# Patient Record
Sex: Female | Born: 1990 | Race: Black or African American | Hispanic: No | Smoking: Never smoker
Health system: Southern US, Community
[De-identification: ages and names within clinical notes are randomized; demographics above are authoritative.]

## PROBLEM LIST (undated history)

## (undated) DIAGNOSIS — J45909 Unspecified asthma, uncomplicated: Secondary | ICD-10-CM

## (undated) HISTORY — DX: Unspecified asthma, uncomplicated: J45.909

---

## 2012-10-17 ENCOUNTER — Emergency Department (INDEPENDENT_AMBULATORY_CARE_PROVIDER_SITE_OTHER)
Admission: EM | Admit: 2012-10-17 | Discharge: 2012-10-17 | Disposition: A | Discharge status: Home / Self Care | Payer: Self-pay | Source: Home / Self Care | Attending: Family Medicine | Admitting: Family Medicine

## 2012-10-17 ENCOUNTER — Encounter (HOSPITAL_COMMUNITY): Payer: Self-pay | Admitting: Emergency Medicine

## 2012-10-17 ENCOUNTER — Emergency Department (INDEPENDENT_AMBULATORY_CARE_PROVIDER_SITE_OTHER): Payer: Self-pay

## 2012-10-17 DIAGNOSIS — M542 Cervicalgia: Secondary | ICD-10-CM

## 2012-10-17 DIAGNOSIS — M549 Dorsalgia, unspecified: Secondary | ICD-10-CM

## 2012-10-17 DIAGNOSIS — Z041 Encounter for examination and observation following transport accident: Secondary | ICD-10-CM

## 2012-10-17 MED ORDER — IBUPROFEN 800 MG PO TABS: 800.0000 mg | ORAL_TABLET | Freq: Three times a day (TID) | ORAL | Status: AC

## 2012-10-17 MED ORDER — CYCLOBENZAPRINE HCL 5 MG PO TABS: 5.0000 mg | ORAL_TABLET | Freq: Three times a day (TID) | ORAL | Status: AC | PRN
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
## 2014-01-14 ENCOUNTER — Other Ambulatory Visit: Payer: Self-pay | Admitting: Family Medicine

## 2014-01-14 ENCOUNTER — Ambulatory Visit
Admission: RE | Admit: 2014-01-14 | Discharge: 2014-01-14 | Disposition: A | Discharge status: Home / Self Care | Payer: BC Managed Care – PPO | Source: Ambulatory Visit | Attending: Family Medicine | Admitting: Family Medicine

## 2014-01-14 DIAGNOSIS — J45909 Unspecified asthma, uncomplicated: Secondary | ICD-10-CM

## 2015-05-08 IMAGING — CR DG CHEST 2V
2 series · 2 of 2 positions shown · non-contrast
Comparison: None.

CLINICAL DATA: Asthma, recent flu, shortness of breath

EXAM:
CHEST  2 VIEW

[w chest pa]
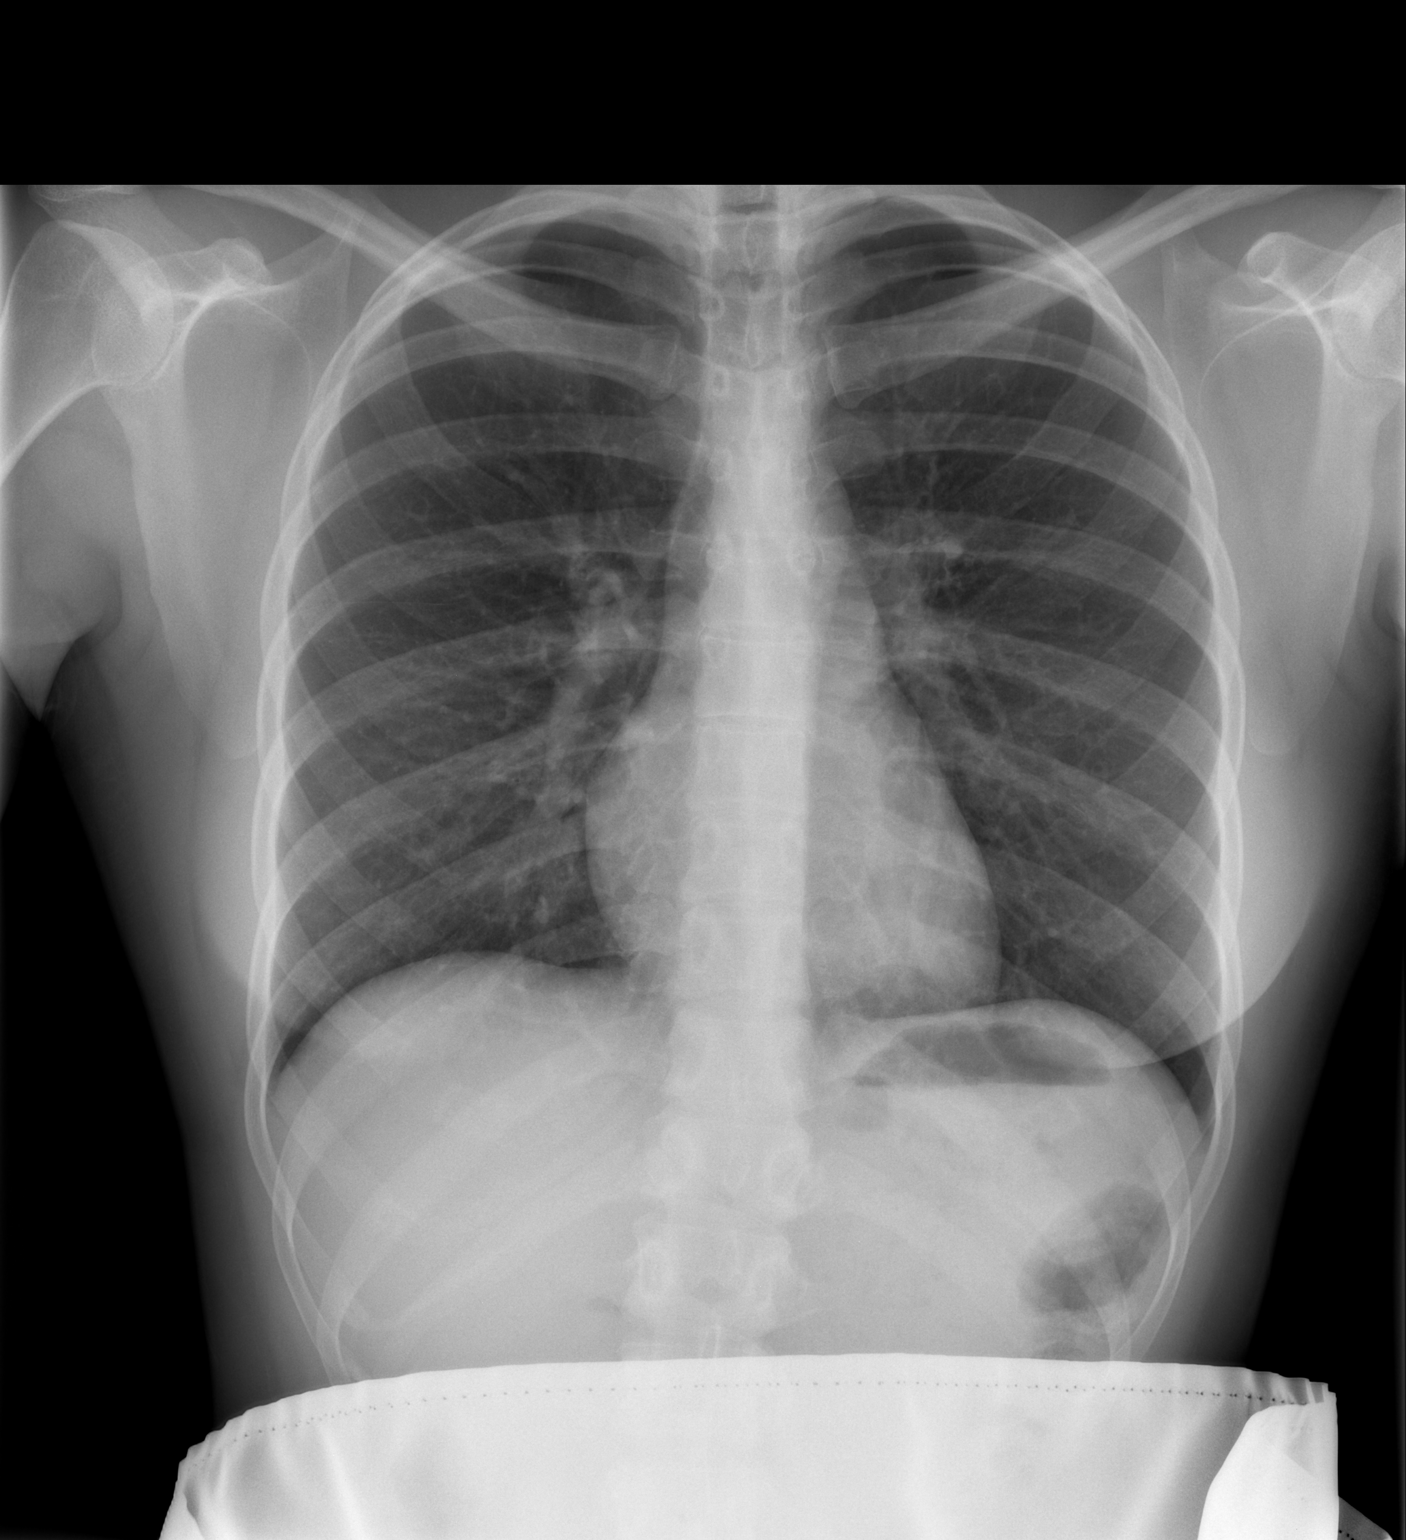

[w chest lat]
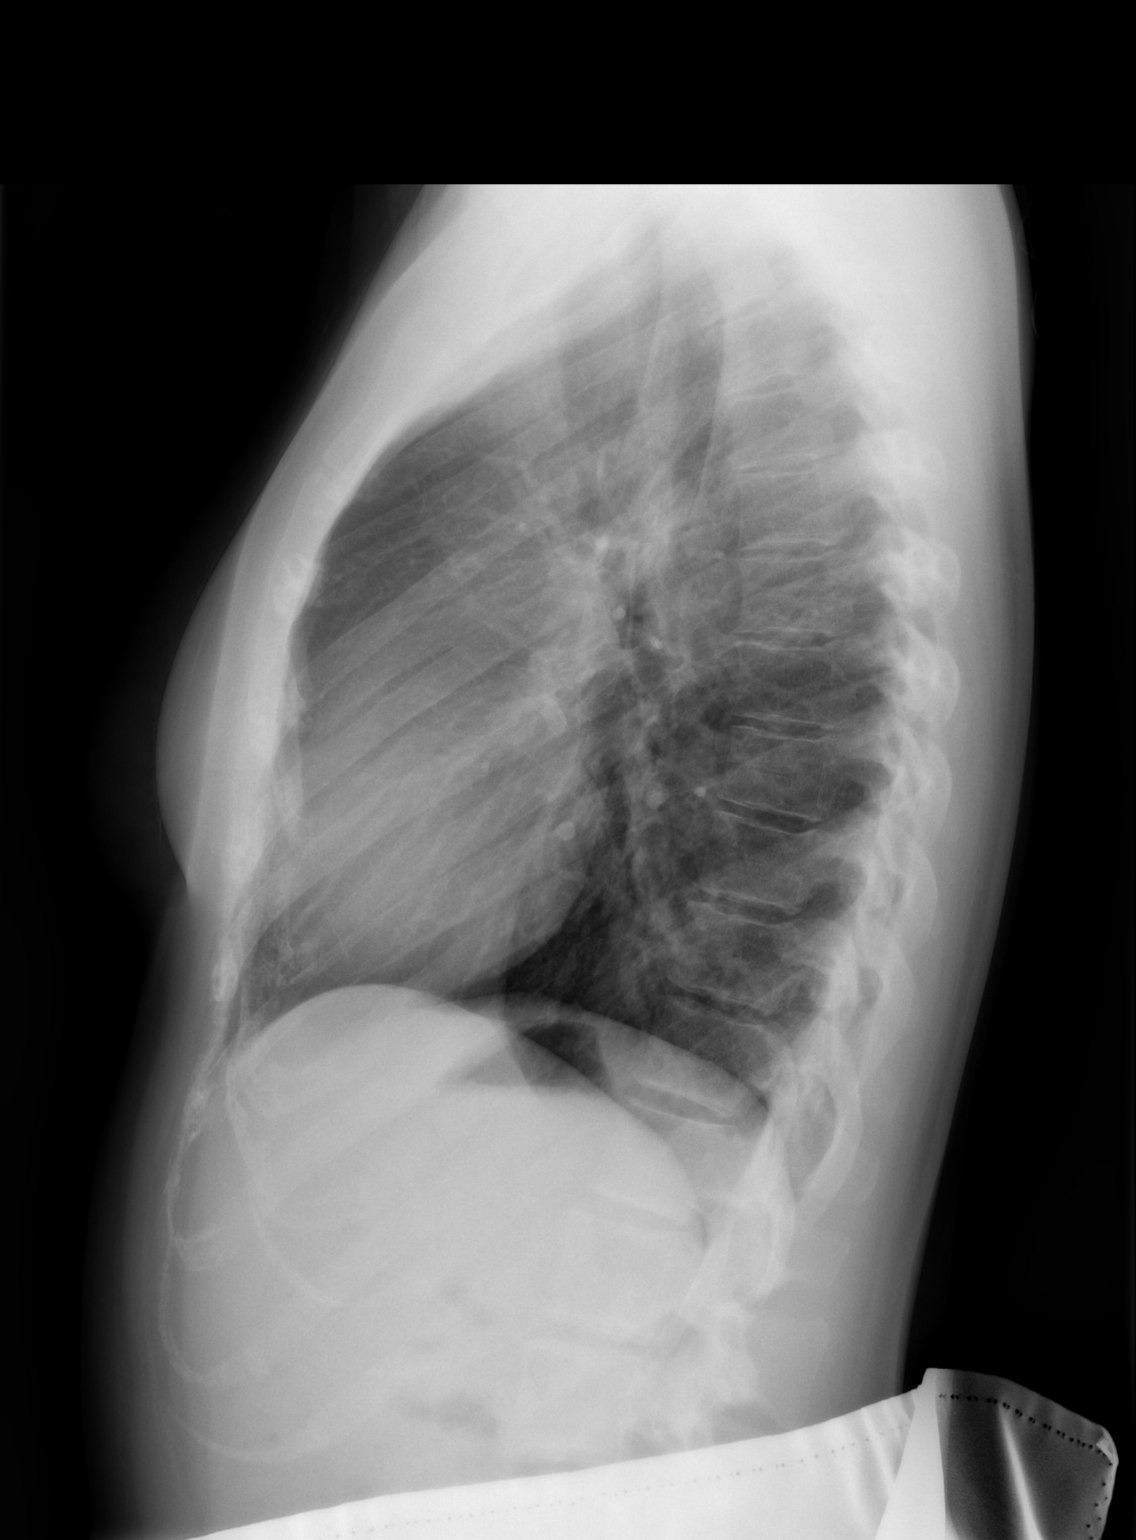

[2 of 2 positions shown; findings below may reference images not displayed]

FINDINGS: No active infiltrate or effusion is seen. Mediastinal contours
appear normal. The heart is within normal limits in size. No bony
abnormality is seen.
IMPRESSION: No active cardiopulmonary disease.

## 2017-10-16 ENCOUNTER — Ambulatory Visit: Payer: Self-pay | Admitting: Family Medicine

## 2017-10-16 NOTE — Progress Notes (Deleted)
  No chief complaint on file.   HPI  4 review of systems  No past medical history on file.  Current Outpatient Medications  Medication Sig Dispense Refill  . cyclobenzaprine (FLEXERIL) 5 MG tablet Take 1 tablet (5 mg total) by mouth 3 (three) times daily as needed for muscle spasms. 30 tablet 0  . ibuprofen (ADVIL,MOTRIN) 800 MG tablet Take 1 tablet (800 mg total) by mouth 3 (three) times daily. For back pains. 30 tablet 0   No current facility-administered medications for this visit.     Allergies: No Known Allergies  *** The histories are not reviewed yet. Please review them in the "History" navigator section and refresh this SmartLink.  Social History   Socioeconomic History  . Marital status: Single    Spouse name: Not on file  . Number of children: Not on file  . Years of education: Not on file  . Highest education level: Not on file  Social Needs  . Financial resource strain: Not on file  . Food insecurity - worry: Not on file  . Food insecurity - inability: Not on file  . Transportation needs - medical: Not on file  . Transportation needs - non-medical: Not on file  Occupational History  . Not on file  Tobacco Use  . Smoking status: Not on file  Substance and Sexual Activity  . Alcohol use: Not on file  . Drug use: Not on file  . Sexual activity: Not on file  Other Topics Concern  . Not on file  Social History Narrative  . Not on file    No family history on file.   ROS Review of Systems See HPI Constitution: No fevers or chills No malaise No diaphoresis Skin: No rash or itching Eyes: no blurry vision, no double vision GU: no dysuria or hematuria Neuro: no dizziness or headaches * all others reviewed and negative   Objective: There were no vitals filed for this visit.  Physical Exam  Assessment and Plan There are no diagnoses linked to this encounter.   Yvonne Boyer

## 2017-10-17 ENCOUNTER — Other Ambulatory Visit: Payer: Self-pay

## 2017-10-17 ENCOUNTER — Encounter: Payer: Self-pay | Admitting: Family Medicine

## 2017-10-17 ENCOUNTER — Ambulatory Visit (INDEPENDENT_AMBULATORY_CARE_PROVIDER_SITE_OTHER): Payer: Managed Care, Other (non HMO) | Admitting: Family Medicine

## 2017-10-17 VITALS — BP 120/70 | HR 88 | Temp 98.9°F | Resp 16 | Ht 66.0 in | Wt 167.8 lb

## 2017-10-17 DIAGNOSIS — R002 Palpitations: Secondary | ICD-10-CM | POA: Diagnosis not present

## 2017-10-17 NOTE — Progress Notes (Signed)
Chief Complaint  Patient presents with  . New Patient (Initial Visit)    discuss ? anxiety, per pt she can just be sitting and heart begins to race, tightness in chest that feels like acid reflux when its stuck in your throat    HPI  She reports that she feels like her heart will just start racing out of the blue She reports that she checks her pulse and it feels fast to her She reports that she sometimes gets this in her sleep She states that in a week she can have up to 3-4 episodes of palpitations She reports that she will typically focus on her breathing to calm herself down The episodes can be 30-60 mins She reports that they have been intermittent but consistent  She reports that it seemed like it's onset was aggravated by a night of drinking alcohol 2 months ago  She states that she has normal period cycles lasting every 6-7 days She denies changes to hair and skin She denies hot or cold intolerance No changes to her bowel movements     Past Medical History:  Diagnosis Date  . Asthma     Current Outpatient Medications  Medication Sig Dispense Refill  . albuterol (PROVENTIL HFA;VENTOLIN HFA) 108 (90 Base) MCG/ACT inhaler Inhale 2 puffs into the lungs every 6 (six) hours as needed for wheezing or shortness of breath.    . valACYclovir (VALTREX) 500 MG tablet Take 500 mg by mouth daily as needed.    . cyclobenzaprine (FLEXERIL) 5 MG tablet Take 1 tablet (5 mg total) by mouth 3 (three) times daily as needed for muscle spasms. (Patient not taking: Reported on 10/17/2017) 30 tablet 0  . ibuprofen (ADVIL,MOTRIN) 800 MG tablet Take 1 tablet (800 mg total) by mouth 3 (three) times daily. For back pains. (Patient not taking: Reported on 10/17/2017) 30 tablet 0   No current facility-administered medications for this visit.     Allergies:  Allergies  Allergen Reactions  . Strawberry Extract     History reviewed. No pertinent surgical history.  Social History    Socioeconomic History  . Marital status: Significant Other    Spouse name: None  . Number of children: None  . Years of education: None  . Highest education level: None  Social Needs  . Financial resource strain: Not hard at all  . Food insecurity - worry: Never true  . Food insecurity - inability: Never true  . Transportation needs - medical: No  . Transportation needs - non-medical: No  Occupational History  . None  Tobacco Use  . Smoking status: Never Smoker  . Smokeless tobacco: Never Used  Substance and Sexual Activity  . Alcohol use: Yes    Comment: 1-3 socially  . Drug use: No  . Sexual activity: None  Other Topics Concern  . None  Social History Narrative  . None    Family History  Adopted: Yes     ROS Review of Systems See HPI Constitution: No fevers or chills No malaise No diaphoresis Skin: No rash or itching Eyes: no blurry vision, no double vision GU: no dysuria or hematuria Neuro: no dizziness or headaches all others reviewed and negative   Objective: Vitals:   10/17/17 1024  BP: 120/70  Pulse: 88  Resp: 16  Temp: 98.9 F (37.2 C)  TempSrc: Oral  SpO2: 100%  Weight: 167 lb 12.8 oz (76.1 kg)  Height: 5\' 6"  (1.676 m)    Physical Exam  Constitutional: She  is oriented to person, place, and time. She appears well-developed and well-nourished.  HENT:  Head: Normocephalic and atraumatic.  Eyes: Conjunctivae and EOM are normal. Pupils are equal, round, and reactive to light.  Neck: Normal range of motion. No thyromegaly present.  Cardiovascular: Normal rate, regular rhythm and normal heart sounds.  No murmur heard. Pulmonary/Chest: Effort normal and breath sounds normal. No stridor. No respiratory distress.  Abdominal: Soft. Bowel sounds are normal. She exhibits no distension. There is no tenderness. There is no guarding.  Musculoskeletal: Normal range of motion. She exhibits no edema.  Neurological: She is alert and oriented to person,  place, and time.  Skin: Skin is warm. Capillary refill takes less than 2 seconds. No erythema.  Psychiatric: She has a normal mood and affect. Her behavior is normal. Judgment and thought content normal.    ECG sinus rhythm, no arrhythmia  Assessment and Plan Yvonne Boyer was seen today for new patient (initial visit).  Diagnoses and all orders for this visit:  Heart palpitations- will send for holter monitor ecg at rest wnl Will check labs Orthostatic vitals in normal ranges  -     EKG 12-Lead -     HOLTER MONITOR - 72 HOUR; Future -     CBC -     TSH -     Basic metabolic panel -     Orthostatic vital signs     Yvonne Boyer

## 2017-10-17 NOTE — Patient Instructions (Addendum)
     IF you received an x-ray today, you will receive an invoice from [facility]. Please contact [facility] at [phone] with questions or concerns regarding your invoice.   IF you received labwork today, you will receive an invoice from [facility]. Please contact [facility] at [phone] with questions or concerns regarding your invoice.   Our billing staff will not be able to assist you with questions regarding bills from these companies.  You will be contacted with the lab results as soon as they are available. The fastest way to get your results is to activate your My Chart account. Instructions are located on the last page of this paperwork. If you have not heard from us regarding the results in 2 weeks, please contact this office.     Holter Monitoring A Holter monitor is a small device that is used to detect abnormal heart rhythms. It clips to your clothing and is connected by wires to flat, sticky disks (electrodes) that attach to your chest. It is worn continuously for 24-48 hours. Follow these instructions at home:  Wear your Holter monitor at all times, even while exercising and sleeping, for as long as directed by your health care provider.  Make sure that the Holter monitor is safely clipped to your clothing or close to your body as recommended by your health care provider.  Do not get the monitor or wires wet.  Do not put body lotion or moisturizer on your chest.  Keep your skin clean.  Keep a diary of your daily activities, such as walking and doing chores. If you feel that your heartbeat is abnormal or that your heart is fluttering or skipping a beat: ? Record what you are doing when it happens. ? Record what time of day the symptoms occur.  Return your Holter monitor as directed by your health care provider.  Keep all follow-up visits as directed by your health care provider. This is important. Get help right away if:  You feel lightheaded or  you faint.  You have trouble breathing.  You feel pain in your chest, upper arm, or jaw.  You feel sick to your stomach and your skin is pale, cool, or damp.  You heartbeat feels unusual or abnormal. This information is not intended to replace advice given to you by your health care provider. Make sure you discuss any questions you have with your health care provider. Document Released: 07/13/2004 Document Revised: 03/22/2016 Document Reviewed: 05/24/2014 Elsevier Interactive Patient Education  [employer]2018 Elsevier Inc.

## 2017-10-18 LAB — CBC
Hematocrit: 42.5 % (ref 34.0–46.6)
Hemoglobin: 13.8 g/dL (ref 11.1–15.9)
MCH: 29.6 pg (ref 26.6–33.0)
MCHC: 32.5 g/dL (ref 31.5–35.7)
MCV: 91 fL (ref 79–97)
PLATELETS: 284 10*3/uL (ref 150–379)
RBC: 4.66 x10E6/uL (ref 3.77–5.28)
RDW: 12.4 % (ref 12.3–15.4)
WBC: 4.9 10*3/uL (ref 3.4–10.8)

## 2017-10-18 LAB — BASIC METABOLIC PANEL
BUN/Creatinine Ratio: 11 (ref 9–23)
BUN: 9 mg/dL (ref 6–20)
CALCIUM: 9.5 mg/dL (ref 8.7–10.2)
CHLORIDE: 106 mmol/L (ref 96–106)
CO2: 21 mmol/L (ref 20–29)
Creatinine, Ser: 0.85 mg/dL (ref 0.57–1.00)
GFR, EST AFRICAN AMERICAN: 109 mL/min/{1.73_m2} (ref >59)
GFR, EST NON AFRICAN AMERICAN: 95 mL/min/{1.73_m2} (ref >59)
Glucose: 81 mg/dL (ref 65–99)
POTASSIUM: 4.4 mmol/L (ref 3.5–5.2)
SODIUM: 141 mmol/L (ref 134–144)

## 2017-10-18 LAB — TSH: TSH: 0.975 u[IU]/mL (ref 0.450–4.500)

## 2017-10-21 ENCOUNTER — Encounter: Payer: Self-pay | Admitting: Family Medicine

## 2017-11-01 ENCOUNTER — Telehealth: Payer: Self-pay | Admitting: Family Medicine

## 2017-11-01 NOTE — Telephone Encounter (Signed)
Spoke with [facility] and they said we could just fax order for holter monitor rather than need a referral. This has been faxed 11/01/17 with insurance card, demo sheet, labs, and ekg

## 2017-11-04 ENCOUNTER — Telehealth: Payer: Self-pay | Admitting: Family Medicine

## 2017-11-04 DIAGNOSIS — R002 Palpitations: Secondary | ICD-10-CM

## 2017-11-04 NOTE — Telephone Encounter (Signed)
April from [facility] called about order for holter monitor. She stated that their holter monitors are for 24 to 48 hours. She said an event monitor could be ordered instead that is weekly but the pt will pay the full price for it even if she only uses it for 3 days. They wanted to know if we would like to do the holter monitor for 48 hrs rather than 72,  or the weekly. Please advise. April can be reached at [phone]. Thanks!

## 2017-11-06 NOTE — Telephone Encounter (Signed)
msg sent to Dr Stallings. 

## 2017-11-06 NOTE — Telephone Encounter (Signed)
Dr Jodi Marblegangi's office called again - the pt is due to ome in this morning.  they need to have a response regarding this issue.  Please return call at [phone]

## 2017-11-08 ENCOUNTER — Encounter: Payer: Self-pay | Admitting: Family Medicine

## 2017-11-08 NOTE — Telephone Encounter (Signed)
Spoke to Cardiology. Pt is wearing an event monitor for a 7 day duration

## 2017-11-21 ENCOUNTER — Ambulatory Visit (INDEPENDENT_AMBULATORY_CARE_PROVIDER_SITE_OTHER): Payer: Managed Care, Other (non HMO) | Admitting: Family Medicine

## 2017-11-21 ENCOUNTER — Encounter: Payer: Self-pay | Admitting: Family Medicine

## 2017-11-21 ENCOUNTER — Other Ambulatory Visit: Payer: Self-pay

## 2017-11-21 VITALS — BP 122/78 | HR 79 | Temp 99.2°F | Resp 18 | Ht 64.96 in | Wt 169.6 lb

## 2017-11-21 DIAGNOSIS — Z3169 Encounter for other general counseling and advice on procreation: Secondary | ICD-10-CM

## 2017-11-21 DIAGNOSIS — R002 Palpitations: Secondary | ICD-10-CM | POA: Diagnosis not present

## 2017-11-21 NOTE — Progress Notes (Signed)
Chief Complaint  Patient presents with  . Palpitations    f/u    HPI   Pt reports that she continues to have palpitations Last night she had an episode of palpitations for 1.5 hours She reports that she is getting them every day She states that she completed the holter monitor  She states that she sometimes stretches which helps and so does taking deep breaths She was evaluated 10/17/2017 and her labs were all normal.   Preconception Pt and her partner are considering pregnancy in the next year They are in same sex marriage Reports that she gets monthly periods Patient's last menstrual period was 11/23/2016 (approximate).  Past Medical History:  Diagnosis Date  . Asthma     Current Outpatient Medications  Medication Sig Dispense Refill  . albuterol (PROVENTIL HFA;VENTOLIN HFA) 108 (90 Base) MCG/ACT inhaler Inhale 2 puffs into the lungs every 6 (six) hours as needed for wheezing or shortness of breath.    . cyclobenzaprine (FLEXERIL) 5 MG tablet Take 1 tablet (5 mg total) by mouth 3 (three) times daily as needed for muscle spasms. 30 tablet 0  . ibuprofen (ADVIL,MOTRIN) 800 MG tablet Take 1 tablet (800 mg total) by mouth 3 (three) times daily. For back pains. 30 tablet 0  . valACYclovir (VALTREX) 500 MG tablet Take 500 mg by mouth daily as needed.     No current facility-administered medications for this visit.     Allergies:  Allergies  Allergen Reactions  . Strawberry Extract     History reviewed. No pertinent surgical history.  Social History   Socioeconomic History  . Marital status: Significant Other    Spouse name: None  . Number of children: 0  . Years of education: None  . Highest education level: None  Social Needs  . Financial resource strain: Not hard at all  . Food insecurity - worry: Never true  . Food insecurity - inability: Never true  . Transportation needs - medical: No  . Transportation needs - non-medical: No  Occupational History  . None   Tobacco Use  . Smoking status: Never Smoker  . Smokeless tobacco: Never Used  Substance and Sexual Activity  . Alcohol use: Yes    Comment: 1-3 socially  . Drug use: No  . Sexual activity: Yes  Other Topics Concern  . None  Social History Narrative  . None    Family History  Adopted: Yes     ROS Review of Systems See HPI Constitution: No fevers or chills No malaise No diaphoresis Skin: No rash or itching Eyes: no blurry vision, no double vision GU: no dysuria or hematuria Neuro: no dizziness or headaches all others reviewed and negative   Objective: Vitals:   11/21/17 0939  BP: 122/78  Pulse: 79  Resp: 18  Temp: 99.2 F (37.3 C)  TempSrc: Oral  SpO2: 99%  Weight: 169 lb 9.6 oz (76.9 kg)  Height: 5' 4.96" (1.65 m)    Physical Exam  Constitutional: She is oriented to person, place, and time. She appears well-developed and well-nourished.  HENT:  Head: Normocephalic and atraumatic.  Eyes: Conjunctivae and EOM are normal.  Cardiovascular: Normal rate, regular rhythm and normal heart sounds.  No murmur heard. Pulmonary/Chest: Effort normal and breath sounds normal. No stridor. No respiratory distress.  Neurological: She is alert and oriented to person, place, and time.  Skin: Skin is warm. Capillary refill takes less than 2 seconds.  Psychiatric: She has a normal mood and affect. Her  behavior is normal. Judgment and thought content normal.    Holter monitor reviewed and only benign findings noted    Assessment and Plan Happy was seen today for palpitations.  Diagnoses and all orders for this visit:  Heart palpitations- discussed metoprolol for palpitations  Preconception counseling- advised pt to start a healthy physical and financial plan Discussed vaccines Discussed prenatal vitamin Pt is in same sex relationship so discussed how to get sperm (friend or sperm bank) Also discussed options and advised discussion with Gynecology    Yvonne Boyer

## 2017-11-21 NOTE — Patient Instructions (Addendum)
For metoprolol: take a 1/2 tablet daily for a month then decrease to every other day next month.      [facility]: Tracey HarriesHenley Thomas F MD  5.0 (1)  Doctor  [address]  [phone]  Open ? Closes 5PM   [facility]  4.0 [phone]  Obstetrician-Gynecologist  [address]  [phone]  Open ? Closes 5PM   [facility]   3.5 (52)  Obstetrician-Gynecologist  [place]  [phone]  Open ? Closes 4:30PM    IF you received an x-ray today, you will receive an invoice from [facility]. Please contact [facility] at [phone] with questions or concerns regarding your invoice.   IF you received labwork today, you will receive an invoice from [facility]. Please contact [facility] at [phone] with questions or concerns regarding your invoice.   Our billing staff will not be able to assist you with questions regarding bills from these companies.  You will be contacted with the lab results as soon as they are available. The fastest way to get your results is to activate your My Chart account. Instructions are located on the last page of this paperwork. If you have not heard from us regarding the results in 2 weeks, please contact this office.

## 2017-11-24 ENCOUNTER — Encounter: Payer: Self-pay | Admitting: Family Medicine

## 2017-11-24 MED ORDER — METOPROLOL SUCCINATE ER 25 MG PO TB24
12.5000 mg | ORAL_TABLET | Freq: Every day | ORAL | 0 refills | Status: DC
Start: 2017-11-24 — End: 2017-12-28

## 2017-11-25 ENCOUNTER — Encounter: Payer: Self-pay | Admitting: Family Medicine

## 2017-11-25 DIAGNOSIS — R002 Palpitations: Secondary | ICD-10-CM | POA: Insufficient documentation

## 2017-12-25 ENCOUNTER — Ambulatory Visit: Payer: Self-pay | Admitting: Family Medicine

## 2017-12-25 NOTE — Progress Notes (Deleted)
  No chief complaint on file.   HPI  4 review of systems  Past Medical History:  Diagnosis Date  . Asthma     Current Outpatient Medications  Medication Sig Dispense Refill  . albuterol (PROVENTIL HFA;VENTOLIN HFA) 108 (90 Base) MCG/ACT inhaler Inhale 2 puffs into the lungs every 6 (six) hours as needed for wheezing or shortness of breath.    . cyclobenzaprine (FLEXERIL) 5 MG tablet Take 1 tablet (5 mg total) by mouth 3 (three) times daily as needed for muscle spasms. 30 tablet 0  . ibuprofen (ADVIL,MOTRIN) 800 MG tablet Take 1 tablet (800 mg total) by mouth 3 (three) times daily. For back pains. 30 tablet 0  . metoprolol succinate (TOPROL-XL) 25 MG 24 hr tablet Take 0.5 tablets (12.5 mg total) by mouth daily. 30 tablet 0  . valACYclovir (VALTREX) 500 MG tablet Take 500 mg by mouth daily as needed.     No current facility-administered medications for this visit.     Allergies:  Allergies  Allergen Reactions  . Strawberry Extract     No past surgical history on file.  Social History   Socioeconomic History  . Marital status: Significant Other    Spouse name: Not on file  . Number of children: 0  . Years of education: Not on file  . Highest education level: Not on file  Social Needs  . Financial resource strain: Not hard at all  . Food insecurity - worry: Never true  . Food insecurity - inability: Never true  . Transportation needs - medical: No  . Transportation needs - non-medical: No  Occupational History  . Not on file  Tobacco Use  . Smoking status: Never Smoker  . Smokeless tobacco: Never Used  Substance and Sexual Activity  . Alcohol use: Yes    Comment: 1-3 socially  . Drug use: No  . Sexual activity: Yes  Other Topics Concern  . Not on file  Social History Narrative  . Not on file    Family History  Adopted: Yes     ROS Review of Systems See HPI Constitution: No fevers or chills No malaise No diaphoresis Skin: No rash or itching Eyes: no  blurry vision, no double vision GU: no dysuria or hematuria Neuro: no dizziness or headaches * all others reviewed and negative   Objective: There were no vitals filed for this visit.  Physical Exam  Assessment and Plan There are no diagnoses linked to this encounter.   Delores P [employer] Corporationaddy

## 2017-12-28 ENCOUNTER — Telehealth: Payer: Self-pay

## 2017-12-28 ENCOUNTER — Ambulatory Visit (INDEPENDENT_AMBULATORY_CARE_PROVIDER_SITE_OTHER): Payer: Managed Care, Other (non HMO) | Admitting: Family Medicine

## 2017-12-28 ENCOUNTER — Other Ambulatory Visit: Payer: Self-pay

## 2017-12-28 ENCOUNTER — Encounter: Payer: Self-pay | Admitting: Family Medicine

## 2017-12-28 VITALS — BP 120/82 | HR 77 | Temp 98.5°F | Resp 18 | Ht 64.96 in | Wt 170.4 lb

## 2017-12-28 DIAGNOSIS — R000 Tachycardia, unspecified: Secondary | ICD-10-CM

## 2017-12-28 DIAGNOSIS — R002 Palpitations: Secondary | ICD-10-CM | POA: Diagnosis not present

## 2017-12-28 MED ORDER — PROPRANOLOL HCL 20 MG PO TABS: 20.0000 mg | ORAL_TABLET | Freq: Every day | ORAL | 0 refills | Status: AC

## 2017-12-28 NOTE — Progress Notes (Signed)
Chief Complaint  Patient presents with  . paperwork completion for ADA/FMLA    HPI   Pt reports that she still feels like her heart is racing despite taking metoprolol She states that she can sometimes feel like nothing is happening and her heart will race The metoprolol xl 12.5mg  is making her feel sleepy. Because of this she is unable to finish phone calls or concentrate  She denies any syncopal or pre-syncopal episodes    Past Medical History:  Diagnosis Date  . Asthma     Current Outpatient Medications  Medication Sig Dispense Refill  . albuterol (PROVENTIL HFA;VENTOLIN HFA) 108 (90 Base) MCG/ACT inhaler Inhale 2 puffs into the lungs every 6 (six) hours as needed for wheezing or shortness of breath.    . cyclobenzaprine (FLEXERIL) 5 MG tablet Take 1 tablet (5 mg total) by mouth 3 (three) times daily as needed for muscle spasms. 30 tablet 0  . ibuprofen (ADVIL,MOTRIN) 800 MG tablet Take 1 tablet (800 mg total) by mouth 3 (three) times daily. For back pains. 30 tablet 0  . valACYclovir (VALTREX) 500 MG tablet Take 500 mg by mouth daily as needed.    . propranolol (INDERAL) 20 MG tablet Take 1 tablet (20 mg total) by mouth daily. 30 tablet 0   No current facility-administered medications for this visit.     Allergies:  Allergies  Allergen Reactions  . Strawberry Extract     No past surgical history on file.  Social History   Socioeconomic History  . Marital status: Significant Other    Spouse name: None  . Number of children: 0  . Years of education: None  . Highest education level: None  Social Needs  . Financial resource strain: Not hard at all  . Food insecurity - worry: Never true  . Food insecurity - inability: Never true  . Transportation needs - medical: No  . Transportation needs - non-medical: No  Occupational History  . None  Tobacco Use  . Smoking status: Never Smoker  . Smokeless tobacco: Never Used  Substance and Sexual Activity  . Alcohol  use: Yes    Comment: 1-3 socially  . Drug use: No  . Sexual activity: Yes  Other Topics Concern  . None  Social History Narrative  . None    Family History  Adopted: Yes     ROS Review of Systems See HPI Constitution: No fevers or chills No malaise No diaphoresis Skin: No rash or itching Eyes: no blurry vision, no double vision GU: no dysuria or hematuria Neuro: no dizziness or headaches all others reviewed and negative   Objective: Vitals:   12/28/17 1355  BP: 120/82  Pulse: 77  Resp: 18  Temp: 98.5 F (36.9 C)  TempSrc: Oral  SpO2: 99%  Weight: 170 lb 6.4 oz (77.3 kg)  Height: 5' 4.96" (1.65 m)    Physical Exam  Constitutional: She is oriented to person, place, and time. She appears well-developed and well-nourished.  HENT:  Head: Normocephalic and atraumatic.  Eyes: Conjunctivae and EOM are normal.  Cardiovascular: Normal rate, regular rhythm and normal heart sounds.  No murmur heard. Pulmonary/Chest: Effort normal and breath sounds normal. No stridor. No respiratory distress. She has no wheezes.  Musculoskeletal: Normal range of motion. She exhibits no edema.  Neurological: She is alert and oriented to person, place, and time.  Skin: Skin is warm. Capillary refill takes less than 2 seconds.  Psychiatric: She has a normal mood and affect. Her behavior  is normal. Judgment and thought content normal.     Assessment and Plan Jannice was seen today for paperwork completion for ada/fmla.  Diagnoses and all orders for this visit:  Heart palpitations -     propranolol (INDERAL) 20 MG tablet; Take 1 tablet (20 mg total) by mouth daily. -     Ambulatory referral to Cardiology  Tachycardia  Other orders -     Cancel: Tdap vaccine greater than or equal to 7yo IM    Pt had holter monitor that showed pvcs Advised to try toprol prn  toprol too strong and pt continued to have palpitations and tachycardia despite her toprol use Will refer for consultation  with Cardiology -  Work up so far shows no anemia, thyroid disorder or electrolyte imbalance  Kyri Dai A Lyndol Vanderheiden

## 2017-12-28 NOTE — Telephone Encounter (Signed)
ADA/FMLA Forms completed by Dr. Creta LevinStallings on 12/28/2017 at clinic appt.  Forms can be picked up on Monday.  Copies of forms placed in the to be scanned box, Mayline Dragon will keep a hard copy and Lera Gaines has the original form. Dgaddy, CMA

## 2017-12-28 NOTE — Patient Instructions (Signed)
     IF you received an x-ray today, you will receive an invoice from [facility]. Please contact [facility] at [phone] with questions or concerns regarding your invoice.   IF you received labwork today, you will receive an invoice from [facility]. Please contact [facility] at [phone] with questions or concerns regarding your invoice.   Our billing staff will not be able to assist you with questions regarding bills from these companies.  You will be contacted with the lab results as soon as they are available. The fastest way to get your results is to activate your My Chart account. Instructions are located on the last page of this paperwork. If you have not heard from us regarding the results in 2 weeks, please contact this office.     

## 2017-12-30 NOTE — Telephone Encounter (Signed)
Paperwork scanned and faxed on 12/30/17 °

## 2018-05-31 ENCOUNTER — Emergency Department (HOSPITAL_COMMUNITY)
Admission: EM | Admit: 2018-05-31 | Discharge: 2018-05-31 | Disposition: A | Discharge status: Home / Self Care | Payer: 59 | Attending: Emergency Medicine | Admitting: Emergency Medicine

## 2018-05-31 ENCOUNTER — Other Ambulatory Visit: Payer: Self-pay

## 2018-05-31 DIAGNOSIS — L259 Unspecified contact dermatitis, unspecified cause: Secondary | ICD-10-CM | POA: Insufficient documentation

## 2018-05-31 DIAGNOSIS — L309 Dermatitis, unspecified: Secondary | ICD-10-CM

## 2018-05-31 DIAGNOSIS — Z79899 Other long term (current) drug therapy: Secondary | ICD-10-CM | POA: Diagnosis not present

## 2018-05-31 DIAGNOSIS — J45909 Unspecified asthma, uncomplicated: Secondary | ICD-10-CM | POA: Insufficient documentation

## 2018-05-31 MED ORDER — TRIAMCINOLONE ACETONIDE 0.1 % EX CREA: 1.0000 "application " | TOPICAL_CREAM | Freq: Two times a day (BID) | CUTANEOUS | 0 refills | Status: DC

## 2018-05-31 MED ORDER — HYDROXYZINE HCL 25 MG PO TABS: 25.0000 mg | ORAL_TABLET | Freq: Four times a day (QID) | ORAL | 0 refills | Status: AC

## 2018-05-31 MED ORDER — PREDNISONE 50 MG PO TABS: ORAL_TABLET | ORAL | 0 refills | Status: AC

## 2018-05-31 NOTE — ED Triage Notes (Signed)
Pt to ER for eczema "flare up." skin rash to right forearm and left breast.

## 2018-05-31 NOTE — ED Provider Notes (Signed)
[facility] EMERGENCY DEPARTMENT Provider Note   CSN: [id number] Arrival date & time: 05/31/18  4098     History   Chief Complaint Chief Complaint  Patient presents with  . Eczema    HPI Yvonne Boyer is a 27 y.o. female who presents to the ED for a rash. Patient reports having eczema and this is a flare up. The areas of dryness and itching are located on the right forearm and left breast. Also on back of knees.  HPI  Past Medical History:  Diagnosis Date  . Asthma     Patient Active Problem List   Diagnosis Date Noted  . Palpitations 11/25/2017    No past surgical history on file.   OB History   None      Home Medications    Prior to Admission medications   Medication Sig Start Date End Date Taking? Authorizing Provider  albuterol (PROVENTIL HFA;VENTOLIN HFA) 108 (90 Base) MCG/ACT inhaler Inhale 2 puffs into the lungs every 6 (six) hours as needed for wheezing or shortness of breath.    [provider]  cyclobenzaprine (FLEXERIL) 5 MG tablet Take 1 tablet (5 mg total) by mouth 3 (three) times daily as needed for muscle spasms. 10/17/12   Linna Hoff, MD  hydrOXYzine (ATARAX/VISTARIL) 25 MG tablet Take 1 tablet (25 mg total) by mouth every 6 (six) hours. 05/31/18   Janne Napoleon, NP  ibuprofen (ADVIL,MOTRIN) 800 MG tablet Take 1 tablet (800 mg total) by mouth 3 (three) times daily. For back pains. 10/17/12   Linna Hoff, MD  predniSONE (DELTASONE) 50 MG tablet Take one tablet PO daily 05/31/18   Janne Napoleon, NP  propranolol (INDERAL) 20 MG tablet Take 1 tablet (20 mg total) by mouth daily. 12/28/17   Doristine Bosworth, MD  triamcinolone cream (KENALOG) 0.1 % Apply 1 application topically 2 (two) times daily. 05/31/18   Janne Napoleon, NP  valACYclovir (VALTREX) 500 MG tablet Take 500 mg by mouth daily as needed.    [provider]    Family History Family History  Adopted: Yes    Social History Social History   Tobacco  Use  . Smoking status: Never Smoker  . Smokeless tobacco: Never Used  Substance Use Topics  . Alcohol use: Yes    Comment: 1-3 socially  . Drug use: No     Allergies   Strawberry extract   Review of Systems Review of Systems  Skin: Positive for rash.       itching  All other systems reviewed and are negative.    Physical Exam Updated Vital Signs BP 123/90 (BP Location: Right Arm)   Pulse 86   Temp 98.9 F (37.2 C) (Oral)   Resp 20   LMP 05/22/2018   SpO2 100%   Physical Exam  Constitutional: Yvonne Boyer appears well-developed and well-nourished. No distress.  HENT:  Head: Normocephalic.  Eyes: EOM are normal.  Neck: Neck supple.  Cardiovascular: Normal rate.  Pulmonary/Chest: Effort normal. No respiratory distress.  Musculoskeletal: Normal range of motion.  Neurological: Yvonne Boyer is alert.  Skin: Rash noted.  Redness, scaling to the right forearm, palmar aspect. The left breast has oozing, crusting lesions, no red streaking or signs of infection. The back of the knees have dry crusty areas.   Psychiatric: Yvonne Boyer has a normal mood and affect. Her behavior is normal.  Nursing note and vitals reviewed.    ED Treatments / Results  Labs (all labs  ordered are listed, but only abnormal results are displayed) Labs Reviewed - No data to display  Radiology No results found.  Procedures Procedures (including critical care time)  Medications Ordered in ED Medications - No data to display   Initial Impression / Assessment and Plan / ED Course  I have reviewed the triage vital signs and the nursing notes. 27 y.o. female here with rash and itching that is similar to eczema flare ups Yvonne Boyer has had in the past. Will treat for rash and itching and patient to f/u with her PCP. Yvonne Boyer will return here for worsening symptoms or signs of infection.   Final Clinical Impressions(s) / ED Diagnoses   Final diagnoses:  Eczema, unspecified type    ED Discharge Orders        Ordered     triamcinolone cream (KENALOG) 0.1 %  2 times daily     05/31/18 0941    hydrOXYzine (ATARAX/VISTARIL) 25 MG tablet  Every 6 hours     05/31/18 0941    predniSONE (DELTASONE) 50 MG tablet     05/31/18 0941       Kerrie Buffaloeese, Lurae [place] Rapids City[place], NP 05/31/18 [id number]    Raeford RazorKohut, Stephen, MD 06/01/18 1739

## 2018-05-31 NOTE — Discharge Instructions (Addendum)
Do not drive while taking the medication for itching as it can make you sleepy. Follow up with your doctor for recheck. If you develop any signs of infection such as red streaking, yellow drainage or worsening symptoms return to the ED.

## 2018-08-07 ENCOUNTER — Encounter: Payer: Self-pay | Admitting: Family Medicine

## 2018-08-11 ENCOUNTER — Telehealth: Payer: Self-pay | Admitting: Family Medicine

## 2018-08-11 NOTE — Telephone Encounter (Signed)
Tried to call pt and reschedule her appt with Dr. Creta Levin for her CPE. Dr. Creta Levin will be leaving early that day and we will need to reschedule.   If pt calls back, please reschedule her for a PHY: CPE with Dr. Creta Levin at her convenience.   Thank you!

## 2018-08-12 ENCOUNTER — Encounter: Payer: 59 | Admitting: Family Medicine

## 2018-08-13 ENCOUNTER — Encounter: Payer: 59 | Admitting: Family Medicine

## 2018-08-15 ENCOUNTER — Encounter: Payer: Self-pay | Admitting: Physician Assistant

## 2018-08-15 ENCOUNTER — Telehealth: Payer: Self-pay | Admitting: Physician Assistant

## 2018-08-15 ENCOUNTER — Ambulatory Visit (INDEPENDENT_AMBULATORY_CARE_PROVIDER_SITE_OTHER): Payer: 59 | Admitting: Physician Assistant

## 2018-08-15 VITALS — BP 116/76 | HR 78 | Temp 98.2°F | Resp 17 | Ht 64.5 in | Wt 168.0 lb

## 2018-08-15 DIAGNOSIS — Z1329 Encounter for screening for other suspected endocrine disorder: Secondary | ICD-10-CM | POA: Diagnosis not present

## 2018-08-15 DIAGNOSIS — Z13220 Encounter for screening for lipoid disorders: Secondary | ICD-10-CM

## 2018-08-15 DIAGNOSIS — Z130 Encounter for screening for diseases of the blood and blood-forming organs and certain disorders involving the immune mechanism: Secondary | ICD-10-CM

## 2018-08-15 DIAGNOSIS — Z113 Encounter for screening for infections with a predominantly sexual mode of transmission: Secondary | ICD-10-CM

## 2018-08-15 DIAGNOSIS — Z0000 Encounter for general adult medical examination without abnormal findings: Secondary | ICD-10-CM

## 2018-08-15 DIAGNOSIS — J4520 Mild intermittent asthma, uncomplicated: Secondary | ICD-10-CM

## 2018-08-15 DIAGNOSIS — Z124 Encounter for screening for malignant neoplasm of cervix: Secondary | ICD-10-CM

## 2018-08-15 DIAGNOSIS — B9689 Other specified bacterial agents as the cause of diseases classified elsewhere: Secondary | ICD-10-CM

## 2018-08-15 DIAGNOSIS — Z13228 Encounter for screening for other metabolic disorders: Secondary | ICD-10-CM

## 2018-08-15 DIAGNOSIS — R21 Rash and other nonspecific skin eruption: Secondary | ICD-10-CM

## 2018-08-15 DIAGNOSIS — N760 Acute vaginitis: Secondary | ICD-10-CM

## 2018-08-15 LAB — POCT URINALYSIS DIP (MANUAL ENTRY)
Bilirubin, UA: NEGATIVE
Blood, UA: NEGATIVE
Glucose, UA: NEGATIVE mg/dL
Ketones, POC UA: NEGATIVE mg/dL
Leukocytes, UA: NEGATIVE
Nitrite, UA: NEGATIVE
Protein Ur, POC: NEGATIVE mg/dL
Spec Grav, UA: 1.025 (ref 1.010–1.025)
Urobilinogen, UA: 0.2 E.U./dL
pH, UA: 5.5 (ref 5.0–8.0)

## 2018-08-15 LAB — POCT WET + KOH PREP
Trich by wet prep: ABSENT
Yeast by KOH: ABSENT
Yeast by wet prep: ABSENT

## 2018-08-15 MED ORDER — ALBUTEROL SULFATE HFA 108 (90 BASE) MCG/ACT IN AERS: 2.0000 | INHALATION_SPRAY | Freq: Four times a day (QID) | RESPIRATORY_TRACT | 1 refills | Status: DC | PRN

## 2018-08-15 MED ORDER — TRIAMCINOLONE ACETONIDE 0.5 % EX OINT: 1.0000 "application " | TOPICAL_OINTMENT | Freq: Two times a day (BID) | CUTANEOUS | 0 refills | Status: DC

## 2018-08-15 MED ORDER — METRONIDAZOLE 0.75 % VA GEL: 1.0000 | Freq: Every day | VAGINAL | 0 refills | Status: AC

## 2018-08-15 NOTE — Patient Instructions (Addendum)
Your test is positive for bacterial vaginosis. Apply 1 applicator of metrogel gel vaginally at night before bed x 5 nights.  No yeast infection.   Kenalog 0.5% is a topical steroid do not use this for >2 weeks on your thigh and > 1 week on your nipple. If used for prolonged periods, this medication can cause thinning and/or discoloration of your skin. If your rash does not improve with this medication we may need to try oral medication.   [email or web address] (also see below for more info about self breast exam)  Keeping You Healthy   Get These Tests  Blood Pressure- Have your blood pressure checked by your healthcare provider at least once a year.  Normal blood pressure is 120/80.  Weight- Have your body mass index (BMI) calculated to screen for obesity.  BMI is a measure of body fat based on height and weight.  You can calculate your own BMI at https://www.west-esparza.com/  Cholesterol- Have your cholesterol checked every year.  Diabetes- Have your blood sugar checked every year if you have high blood pressure, high cholesterol, a family history of diabetes or if you are overweight.  Pap Test - Have a pap test every 1 to 5 years if you have been sexually active.  If you are older than 65 and recent pap tests have been normal you may not need additional pap tests.  In addition, if you have had a hysterectomy  for benign disease additional pap tests are not necessary.  Mammogram-Yearly mammograms are essential for early detection of breast cancer  Screening for Colon Cancer- Colonoscopy starting at age 50. Screening may begin sooner depending on your family history and other health conditions.  Follow up colonoscopy as directed by your Gastroenterologist.  Screening for Osteoporosis- Screening begins at age 74 with bone density scanning, sooner if you are at higher risk for developing Osteoporosis.   Get these medicines  Calcium with Vitamin D- Your body requires 1200-1500 mg of  Calcium a day and [phone] IU of Vitamin D a day.  You can only absorb 500 mg of Calcium at a time therefore Calcium must be taken in 2 or 3 separate doses throughout the day.  Hormones- Hormone therapy has been associated with increased risk for certain cancers and heart disease.  Talk to your healthcare provider about if you need relief from menopausal symptoms.  Aspirin- Ask your healthcare provider about taking Aspirin to prevent Heart Disease and Stroke.   Get these Immuniztions  Flu shot- Every fall  Pneumonia shot- Once after the age of 48; if you are younger ask your healthcare provider if you need a pneumonia shot.  Tetanus- Every ten years.  Zostavax- Once after the age of 78 to prevent shingles.   Take these steps  Don't smoke- Your healthcare provider can help you quit. For tips on how to quit, ask your healthcare provider or go to www.smokefree.gov or call 1-800 QUIT-NOW.  Be physically active- Exercise 5 days a week for a minimum of 30 minutes.  If you are not already physically active, start slow and gradually work up to 30 minutes of moderate physical activity.  Try walking, dancing, bike riding, swimming, etc.  Eat a healthy diet- Eat a variety of healthy foods such as fruits, vegetables, whole grains, low fat milk, low fat cheeses, yogurt, lean meats, chicken, fish, eggs, dried beans, tofu, etc.  For more information go to www.thenutritionsource.org  Dental visit- Brush and floss teeth twice daily; visit your dentist twice a  year.  Eye exam- Visit your Optometrist or Ophthalmologist yearly.  Drink alcohol in moderation- Limit alcohol intake to one drink or less a day.  Never drink and drive.  Depression- Your emotional health is as important as your physical health.  If you're feeling down or losing interest in things you normally enjoy, please talk to your healthcare provider.  Seat Belts- can save your life; always wear one  Smoke/Carbon Monoxide detectors- These  detectors need to be installed on the appropriate level of your home.  Replace batteries at least once a year.  Violence- If anyone is threatening or hurting you, please tell your healthcare provider.  Living Will/ Health care power of attorney- Discuss with your healthcare provider and family.  If you have lab work done today you will be contacted with your lab results within the next 2 weeks.  If you have not heard from [place] then please contact us. The fastest way to get your results is to register for My Chart.   Breast Self-Awareness Breast self-awareness means being familiar with how your breasts look and feel. It involves checking your breasts regularly and reporting any changes to your health care provider. Practicing breast self-awareness is important. A change in your breasts can be a sign of a serious medical problem. Being familiar with how your breasts look and feel allows you to find any problems early, when treatment is more likely to be successful. All women should practice breast self-awareness, including women who have had breast implants. How to do a breast self-exam One way to learn what is normal for your breasts and whether your breasts are changing is to do a breast self-exam. To do a breast self-exam: Look for Changes  1. Remove all the clothing above your waist. 2. Stand in front of a mirror in a room with good lighting. 3. Put your hands on your hips. 4. Push your hands firmly downward. 5. Compare your breasts in the mirror. Look for differences between them (asymmetry), such as: ? Differences in shape. ? Differences in size. ? Puckers, dips, and bumps in one breast and not the other. 6. Look at each breast for changes in your skin, such as: ? Redness. ? Scaly areas. 7. Look for changes in your nipples, such as: ? Discharge. ? Bleeding. ? Dimpling. ? Redness. ? A change in position. Feel for Changes  Carefully feel your breasts for lumps and changes. It is  best to do this while lying on your back on the floor and again while sitting or standing in the shower or tub with soapy water on your skin. Feel each breast in the following way:  Place the arm on the side of the breast you are examining above your head.  Feel your breast with the other hand.  Start in the nipple area and make  inch (2 cm) overlapping circles to feel your breast. Use the pads of your three middle fingers to do this. Apply light pressure, then medium pressure, then firm pressure. The light pressure will allow you to feel the tissue closest to the skin. The medium pressure will allow you to feel the tissue that is a little deeper. The firm pressure will allow you to feel the tissue close to the ribs.  Continue the overlapping circles, moving downward over the breast until you feel your ribs below your breast.  Move one finger-width toward the center of the body. Continue to use the  inch (2 cm) overlapping circles to feel  your breast as you move slowly up toward your collarbone.  Continue the up and down exam using all three pressures until you reach your armpit.  Write Down What You Find  Write down what is normal for each breast and any changes that you find. Keep a written record with breast changes or normal findings for each breast. By writing this information down, you do not need to depend only on memory for size, tenderness, or location. Write down where you are in your menstrual cycle, if you are still menstruating. If you are having trouble noticing differences in your breasts, do not get discouraged. With time you will become more familiar with the variations in your breasts and more comfortable with the exam. How often should I examine my breasts? Examine your breasts every month. If you are breastfeeding, the best time to examine your breasts is after a feeding or after using a breast pump. If you menstruate, the best time to examine your breasts is 5-7 days after  your period is over. During your period, your breasts are lumpier, and it may be more difficult to notice changes. When should I see my health care provider? See your health care provider if you notice:  A change in shape or size of your breasts or nipples.  A change in the skin of your breast or nipples, such as a reddened or scaly area.  Unusual discharge from your nipples.  A lump or thick area that was not there before.  Pain in your breasts.  Anything that concerns you.  This information is not intended to replace advice given to you by your health care provider. Make sure you discuss any questions you have with your health care provider. Document Released: 10/15/2005 Document Revised: 03/22/2016 Document Reviewed: 09/04/2015 Elsevier Interactive Patient Education  2018 [employer].  IF you received an x-ray today, you will receive an invoice from [facility]. Please contact [facility] at [phone] with questions or concerns regarding your invoice.   IF you received labwork today, you will receive an invoice from [facility]. Please contact [facility] at [phone] with questions or concerns regarding your invoice.   Our billing staff will not be able to assist you with questions regarding bills from these companies.  You will be contacted with the lab results as soon as they are available. The fastest way to get your results is to activate your My Chart account. Instructions are located on the last page of this paperwork. If you have not heard from [place] regarding the results in 2 weeks, please contact this office.

## 2018-08-15 NOTE — Telephone Encounter (Signed)
Copied from CRM [id number]. Topic: General - Other >> Aug 15, 2018 10:35 AM Jaquita Rector A wrote: Reason for CRM: Patient called back for Burna Mortimer said she had a message to call her back. Ph# [phone]

## 2018-08-15 NOTE — Progress Notes (Signed)
[facility] [place], [place] [address] [phone]  Date:  08/15/2018   Name:  Yvonne Boyer   DOB:  1991-03-29   MRN:  [id number]  PCP:  Yvonne Moron, MD    Chief Complaint: Annual Exam   History of Present Illness:  This is a 27 y.o. female with PMH  has a past medical history of Asthma. and eczema who is presenting for CPE. She is a pt of Dr. Nolon Boyer. She is not fasting today.   Asthma - needs refill of inhaler. Albuterol.  Uses inhaler 1-3 times/month "I think it's expired"  Complaints: eczema flare last month. Still itching. Using kenalog 0.1% daily. Spot on nipple and thigh that won't go away.  LMP: 08/01/2018 (Approximate)  Last pap: "it's been a while" no known abnormal PAPs Sexual history: sexually active with her wife only. No kids.  Immunizations: declines flu shot today. Needs tdap Dentist: regular Q6 months.  Eye: 20/20 b/l Diet/Exercise: no regular exercise. Eats too big servings. Mostly cooks at home.   Wt Readings from Last 3 Encounters:  08/15/18 168 lb (76.2 kg)  12/28/17 170 lb 6.4 oz (77.3 kg)  11/21/17 169 lb 9.6 oz (76.9 kg)    Review of Systems:  Review of Systems  Constitutional: Negative for chills, diaphoresis, fatigue and fever.  HENT: Negative for congestion, postnasal drip, rhinorrhea, sinus pressure, sneezing and sore throat.   Respiratory: Negative for cough, chest tightness, shortness of breath and wheezing.   Cardiovascular: Negative for chest pain and palpitations.  Gastrointestinal: Negative for abdominal pain, diarrhea, nausea and vomiting.  Genitourinary: Negative for decreased urine volume, difficulty urinating, dysuria, enuresis, flank pain, frequency, hematuria and urgency.  Musculoskeletal: Negative for back pain.  Skin: Positive for rash.  Neurological: Negative for dizziness, weakness, light-headedness and headaches.    Patient Active Problem List   Diagnosis Date Noted  . Palpitations  11/25/2017    Prior to Admission medications   Medication Sig Start Date End Date Taking? Authorizing Provider  albuterol (PROVENTIL HFA;VENTOLIN HFA) 108 (90 Base) MCG/ACT inhaler Inhale 2 puffs into the lungs every 6 (six) hours as needed for wheezing or shortness of breath.   Yes [provider]  cyclobenzaprine (FLEXERIL) 5 MG tablet Take 1 tablet (5 mg total) by mouth 3 (three) times daily as needed for muscle spasms. 10/17/12  Yes Kindl, Nelda Severe, MD  hydrOXYzine (ATARAX/VISTARIL) 25 MG tablet Take 1 tablet (25 mg total) by mouth every 6 (six) hours. 05/31/18  Yes Neese, [place], NP  ibuprofen (ADVIL,MOTRIN) 800 MG tablet Take 1 tablet (800 mg total) by mouth 3 (three) times daily. For back pains. 10/17/12  Yes Billy Fischer, MD  predniSONE (DELTASONE) 50 MG tablet Take one tablet PO daily 05/31/18  Yes Neese, [place], NP  propranolol (INDERAL) 20 MG tablet Take 1 tablet (20 mg total) by mouth daily. 12/28/17  Yes Stallings, Zoe A, MD  triamcinolone cream (KENALOG) 0.1 % Apply 1 application topically 2 (two) times daily. 05/31/18  Yes Neese, Kensal M, NP  valACYclovir (VALTREX) 500 MG tablet Take 500 mg by mouth daily as needed.   Yes [provider]    Allergies  Allergen Reactions  . Strawberry Extract     No past surgical history on file.  Social History   Tobacco Use  . Smoking status: Never Smoker  . Smokeless tobacco: Never Used  Substance Use Topics  . Alcohol use: Yes    Comment:  1-3 socially  . Drug use: No    Family History  Adopted: Yes    Medication list has been reviewed and updated.  Physical Examination:  Physical Exam  Constitutional: She is oriented to person, place, and time. She appears well-developed and well-nourished. No distress.  HENT:  Head: Normocephalic and atraumatic.  Mouth/Throat: Oropharynx is clear and moist.  Eyes: Pupils are equal, round, and reactive to light. Conjunctivae and EOM are normal.  Neck: Normal range of motion.  Neck supple. No thyromegaly present.  Cardiovascular: Normal rate, regular rhythm and normal heart sounds.  No murmur heard. Pulmonary/Chest: Effort normal and breath sounds normal. She has no wheezes.  Abdominal: Soft. She exhibits no mass. There is no tenderness.  Genitourinary: Vagina normal. Cervix exhibits no motion tenderness and no discharge. Right adnexum displays no mass and no tenderness. Left adnexum displays no mass and no tenderness.  Musculoskeletal: Normal range of motion.  Neurological: She is alert and oriented to person, place, and time. She has normal reflexes.  Skin: Skin is warm and dry.  Psychiatric: She has a normal mood and affect. Her behavior is normal. Judgment and thought content normal.  Vitals reviewed.   BP 116/76   Pulse 78   Temp 98.2 F (36.8 C) (Oral)   Resp 17   Ht 5' 4.5" (1.638 m)   Wt 168 lb (76.2 kg)   LMP 08/01/2018 (Approximate)   SpO2 98%   BMI 28.39 kg/m  Results for orders placed or performed in visit on 08/15/18  POCT urinalysis dipstick  Result Value Ref Range   Color, UA yellow yellow   Clarity, UA clear clear   Glucose, UA negative negative mg/dL   Bilirubin, UA negative negative   Ketones, POC UA negative negative mg/dL   Spec Grav, UA 1.025 1.010 - 1.025   Blood, UA negative negative   pH, UA 5.5 5.0 - 8.0   Protein Ur, POC negative negative mg/dL   Urobilinogen, UA 0.2 0.2 or 1.0 E.U./dL   Nitrite, UA Negative Negative   Leukocytes, UA Negative Negative  POCT Wet + KOH Prep  Result Value Ref Range   Yeast by KOH Absent Absent   Yeast by wet prep Absent Absent   WBC by wet prep Few Few   Clue Cells Wet Prep HPF POC Few (A) None   Trich by wet prep Absent Absent   Bacteria Wet Prep HPF POC Many (A) Few   Epithelial Cells By [employer] Pref ([facility]) Few None, Few, Too numerous to count   RBC,UR,HPF,POC None None RBC/hpf    Assessment and Plan: 1. Annual physical exam - Pt presents to clinic for annual exam. C/o con't rash on  thigh and nipple. Will treat with kenalog - medication side effect profile discussed. PAP done today. Routine labs are pending, will contact with results. Anticipatory guidance discussed.   2. Screening for endocrine, metabolic and immunity disorder - CMP14+EGFR - CBC with Differential/Platelet - POCT urinalysis dipstick  3. Screening, lipid - Lipid panel  4. Screen for STD (sexually transmitted disease) - HIV Antibody (routine testing w rflx) - RPR - POCT Wet + KOH Prep  5. Screening for cervical cancer - Pap IG, CT/NG w/ reflex HPV when ASC-U  6. Mild intermittent asthma without complication - albuterol (PROVENTIL HFA;VENTOLIN HFA) 108 (90 Base) MCG/ACT inhaler; Inhale 2 puffs into the lungs every 6 (six) hours as needed for wheezing or shortness of breath.  Dispense: 1 Inhaler; Refill: 1  7. Rash and  nonspecific skin eruption - triamcinolone ointment (KENALOG) 0.5 %; Apply 1 application topically 2 (two) times daily.  Dispense: 30 g; Refill: 0  8. BV (bacterial vaginosis) -+clue cells on wet prep. Will treat with metrogel.  - metroNIDAZOLE (METROGEL VAGINAL) 0.75 % vaginal gel; Place 1 Applicatorful vaginally at bedtime for 5 days.  Dispense: 70 g; Refill: 0   Whitney , PA-C  [facility] [place] 08/15/2018 9:21 AM

## 2018-08-16 LAB — CBC WITH DIFFERENTIAL/PLATELET
Basophils Absolute: 0.1 10*3/uL (ref 0.0–0.2)
Basos: 1 %
EOS (ABSOLUTE): 0.2 10*3/uL (ref 0.0–0.4)
Eos: 3 %
Hematocrit: 45.5 % (ref 34.0–46.6)
Hemoglobin: 15 g/dL (ref 11.1–15.9)
Immature Grans (Abs): 0 10*3/uL (ref 0.0–0.1)
Immature Granulocytes: 0 %
Lymphocytes Absolute: 2.1 10*3/uL (ref 0.7–3.1)
Lymphs: 37 %
MCH: 29.9 pg (ref 26.6–33.0)
MCHC: 33 g/dL (ref 31.5–35.7)
MCV: 91 fL (ref 79–97)
Monocytes Absolute: 0.4 10*3/uL (ref 0.1–0.9)
Monocytes: 7 %
Neutrophils Absolute: 2.9 10*3/uL (ref 1.4–7.0)
Neutrophils: 52 %
Platelets: 285 10*3/uL (ref 150–450)
RBC: 5.02 x10E6/uL (ref 3.77–5.28)
RDW: 11.7 % — ABNORMAL LOW (ref 12.3–15.4)
WBC: 5.7 10*3/uL (ref 3.4–10.8)

## 2018-08-16 LAB — LIPID PANEL
Chol/HDL Ratio: 1.9 ratio (ref 0.0–4.4)
Cholesterol, Total: 127 mg/dL (ref 100–199)
HDL: 66 mg/dL (ref >39)
LDL Calculated: 51 mg/dL (ref 0–99)
Triglycerides: 49 mg/dL (ref 0–149)
VLDL Cholesterol Cal: 10 mg/dL (ref 5–40)

## 2018-08-16 LAB — CMP14+EGFR
ALT: 7 IU/L (ref 0–32)
AST: 10 IU/L (ref 0–40)
Albumin/Globulin Ratio: 1.7 (ref 1.2–2.2)
Albumin: 4.3 g/dL (ref 3.5–5.5)
Alkaline Phosphatase: 78 IU/L (ref 39–117)
BUN/Creatinine Ratio: 9 (ref 9–23)
BUN: 8 mg/dL (ref 6–20)
Bilirubin Total: 0.3 mg/dL (ref 0.0–1.2)
CO2: 23 mmol/L (ref 20–29)
Calcium: 9.5 mg/dL (ref 8.7–10.2)
Chloride: 103 mmol/L (ref 96–106)
Creatinine, Ser: 0.89 mg/dL (ref 0.57–1.00)
GFR calc Af Amer: 103 mL/min/{1.73_m2} (ref >59)
GFR calc non Af Amer: 89 mL/min/{1.73_m2} (ref >59)
Globulin, Total: 2.6 g/dL (ref 1.5–4.5)
Glucose: 81 mg/dL (ref 65–99)
Potassium: 4.1 mmol/L (ref 3.5–5.2)
Sodium: 141 mmol/L (ref 134–144)
Total Protein: 6.9 g/dL (ref 6.0–8.5)

## 2018-08-16 LAB — RPR: RPR Ser Ql: NONREACTIVE

## 2018-08-16 LAB — HIV ANTIBODY (ROUTINE TESTING W REFLEX): HIV Screen 4th Generation wRfx: NONREACTIVE

## 2018-08-20 LAB — PAP IG, CT-NG, RFX HPV ASCU
Chlamydia, Nuc. Acid Amp: NEGATIVE
Gonococcus by Nucleic Acid Amp: NEGATIVE
PAP Smear Comment: 0

## 2018-08-20 NOTE — Progress Notes (Signed)
Labs look great. Next PAP in 3 years. Released to mychart.

## 2019-04-06 ENCOUNTER — Encounter: Payer: Self-pay | Admitting: Physician Assistant

## 2019-04-06 ENCOUNTER — Other Ambulatory Visit: Payer: Self-pay | Admitting: Physician Assistant

## 2019-04-06 ENCOUNTER — Other Ambulatory Visit: Payer: Self-pay

## 2019-04-06 DIAGNOSIS — R21 Rash and other nonspecific skin eruption: Secondary | ICD-10-CM

## 2019-04-06 DIAGNOSIS — J4520 Mild intermittent asthma, uncomplicated: Secondary | ICD-10-CM

## 2019-04-06 MED ORDER — ALBUTEROL SULFATE HFA 108 (90 BASE) MCG/ACT IN AERS: 2.0000 | INHALATION_SPRAY | Freq: Four times a day (QID) | RESPIRATORY_TRACT | 1 refills | Status: AC | PRN

## 2019-04-06 MED ORDER — TRIAMCINOLONE ACETONIDE 0.5 % EX OINT: 1.0000 "application " | TOPICAL_OINTMENT | Freq: Two times a day (BID) | CUTANEOUS | 0 refills | Status: DC

## 2019-05-12 ENCOUNTER — Other Ambulatory Visit: Payer: Self-pay | Admitting: Physician Assistant

## 2019-05-12 DIAGNOSIS — R21 Rash and other nonspecific skin eruption: Secondary | ICD-10-CM

## 2019-05-12 NOTE — Telephone Encounter (Signed)
Would Dr. Nolon Rod like to continue this rx

## 2023-11-16 ENCOUNTER — Encounter (HOSPITAL_COMMUNITY): Payer: Self-pay

## 2023-11-16 ENCOUNTER — Emergency Department (HOSPITAL_COMMUNITY)
Admission: EM | Admit: 2023-11-16 | Discharge: 2023-11-16 | Disposition: A | Discharge status: Home / Self Care | Payer: Managed Care, Other (non HMO) | Attending: Emergency Medicine | Admitting: Emergency Medicine

## 2023-11-16 ENCOUNTER — Other Ambulatory Visit: Payer: Self-pay

## 2023-11-16 DIAGNOSIS — R1031 Right lower quadrant pain: Secondary | ICD-10-CM | POA: Insufficient documentation

## 2023-11-16 LAB — HCG, SERUM, QUALITATIVE: Preg, Serum: NEGATIVE

## 2023-11-16 LAB — URINALYSIS, ROUTINE W REFLEX MICROSCOPIC
Bilirubin Urine: NEGATIVE
Glucose, UA: NEGATIVE mg/dL
Hgb urine dipstick: NEGATIVE
Ketones, ur: NEGATIVE mg/dL
Leukocytes,Ua: NEGATIVE
Nitrite: NEGATIVE
Protein, ur: NEGATIVE mg/dL
Specific Gravity, Urine: 1.018 (ref 1.005–1.030)
pH: 5 (ref 5.0–8.0)

## 2023-11-16 LAB — COMPREHENSIVE METABOLIC PANEL
ALT: 15 U/L (ref 0–44)
AST: 16 U/L (ref 15–41)
Albumin: 3.7 g/dL (ref 3.5–5.0)
Alkaline Phosphatase: 53 U/L (ref 38–126)
Anion gap: 10 (ref 5–15)
BUN: 9 mg/dL (ref 6–20)
CO2: 21 mmol/L — ABNORMAL LOW (ref 22–32)
Calcium: 9.5 mg/dL (ref 8.9–10.3)
Chloride: 108 mmol/L (ref 98–111)
Creatinine, Ser: 0.99 mg/dL (ref 0.44–1.00)
GFR, Estimated: >60 mL/min (ref >60)
Glucose, Bld: 96 mg/dL (ref 70–99)
Potassium: 3.7 mmol/L (ref 3.5–5.1)
Sodium: 139 mmol/L (ref 135–145)
Total Bilirubin: 0.7 mg/dL (ref 0.0–1.2)
Total Protein: 6.8 g/dL (ref 6.5–8.1)

## 2023-11-16 LAB — CBC
HCT: 40.3 % (ref 36.0–46.0)
Hemoglobin: 13.6 g/dL (ref 12.0–15.0)
MCH: 30.8 pg (ref 26.0–34.0)
MCHC: 33.7 g/dL (ref 30.0–36.0)
MCV: 91.4 fL (ref 80.0–100.0)
Platelets: 271 10*3/uL (ref 150–400)
RBC: 4.41 MIL/uL (ref 3.87–5.11)
RDW: 11.7 % (ref 11.5–15.5)
WBC: 5.2 10*3/uL (ref 4.0–10.5)
nRBC: 0 % (ref 0.0–0.2)

## 2023-11-16 LAB — LIPASE, BLOOD: Lipase: 34 U/L (ref 11–51)

## 2023-11-16 MED ORDER — MELOXICAM 15 MG PO TABS: 15.0000 mg | ORAL_TABLET | Freq: Every day | ORAL | 0 refills | Status: AC

## 2023-11-16 NOTE — Discharge Instructions (Signed)
Please take Mobic,  once daily as needed for pain - this in an antiinflammatory medicine (NSAID) and is similar to ibuprofen - many people feel that it is stronger than ibuprofen and it is easier to take since it is a smaller pill.  Please use this only for 1 week - if your pain persists, you will need to follow up with your doctor in the office for ongoing guidance and pain control.  Thank you for allowing [place] to treat you in the emergency department today.  After reviewing your examination and potential testing that was done it appears that you are safe to go home.  I would like for you to follow-up with your doctor within the next several days, have them obtain your records and follow-up with them to review all potential tests and results from your visit.  If you should develop severe or worsening symptoms return to the emergency department immediately

## 2023-11-16 NOTE — ED Triage Notes (Signed)
Pt c/o RLQ pain that occurs when she bends over for past 2 weeks. Pt denies nausea, vomiting, diarrhea, or constipation.

## 2023-11-16 NOTE — ED Provider Notes (Signed)
[place] EMERGENCY DEPARTMENT AT [facility] Provider Note   CSN: [id number] Arrival date & time: 11/16/23  7829     History  Chief Complaint  Patient presents with   Abdominal Pain    Yvonne Boyer is a 33 y.o. female.   Abdominal Pain    33 year old female no prior abdominal surgical history, only on an inhaler for chronic medications presents with right lower quadrant pain for 2 weeks that she only feels when she bends over.  There is no associated nausea vomiting diarrhea dysuria or any urinary symptoms.  She has a normal appetite, no fevers or chills, she states it only happens when she bends over, it does not happen at rest or with exertion.  Home Medications Prior to Admission medications   Medication Sig Start Date End Date Taking? Authorizing Provider  meloxicam (MOBIC) 15 MG tablet Take 1 tablet (15 mg total) by mouth daily for 14 days. 11/16/23 11/30/23 Yes Eber Hong, MD  albuterol (VENTOLIN HFA) 108 (90 Base) MCG/ACT inhaler Inhale 2 puffs into the lungs every 6 (six) hours as needed for wheezing or shortness of breath. 04/06/19   Doristine Bosworth, MD  cyclobenzaprine (FLEXERIL) 5 MG tablet Take 1 tablet (5 mg total) by mouth 3 (three) times daily as needed for muscle spasms. 10/17/12   Linna Hoff, MD  hydrOXYzine (ATARAX/VISTARIL) 25 MG tablet Take 1 tablet (25 mg total) by mouth every 6 (six) hours. 05/31/18   Janne Napoleon, NP  ibuprofen (ADVIL,MOTRIN) 800 MG tablet Take 1 tablet (800 mg total) by mouth 3 (three) times daily. For back pains. 10/17/12   Linna Hoff, MD  predniSONE (DELTASONE) 50 MG tablet Take one tablet PO daily 05/31/18   Janne Napoleon, NP  propranolol (INDERAL) 20 MG tablet Take 1 tablet (20 mg total) by mouth daily. 12/28/17   Doristine Bosworth, MD  triamcinolone ointment (KENALOG) 0.5 % APPLY EXTERNALLY TO THE AFFECTED AREA TWICE DAILY 05/17/19   Doristine Bosworth, MD  valACYclovir (VALTREX) 500 MG tablet Take 500 mg by mouth daily as  needed.    [provider]      Allergies    Strawberry extract    Review of Systems   Review of Systems  Gastrointestinal:  Positive for abdominal pain.  All other systems reviewed and are negative.   Physical Exam Updated Vital Signs BP 126/78 (BP Location: Right Arm)   Pulse 90   Temp 98.5 F (36.9 C)   Resp 19   Ht 1.6 m (5\' 3" )   Wt 67.1 kg   LMP 10/24/2023 (Exact Date)   SpO2 97%   BMI 26.22 kg/m  Physical Exam Vitals and nursing note reviewed.  Constitutional:      General: She is not in acute distress.    Appearance: She is well-developed.  HENT:     Head: Normocephalic and atraumatic.     Mouth/Throat:     Pharynx: No oropharyngeal exudate.  Eyes:     General: No scleral icterus.       Right eye: No discharge.        Left eye: No discharge.     Conjunctiva/sclera: Conjunctivae normal.     Pupils: Pupils are equal, round, and reactive to light.  Neck:     Thyroid: No thyromegaly.     Vascular: No JVD.  Cardiovascular:     Rate and Rhythm: Normal rate and regular rhythm.     Heart sounds: Normal  heart sounds. No murmur heard.    No friction rub. No gallop.  Pulmonary:     Effort: Pulmonary effort is normal. No respiratory distress.     Breath sounds: Normal breath sounds. No wheezing or rales.  Abdominal:     General: Bowel sounds are normal. There is no distension.     Palpations: Abdomen is soft. There is no mass.     Tenderness: There is no abdominal tenderness.  Musculoskeletal:        General: No tenderness. Normal range of motion.     Cervical back: Normal range of motion and neck supple.  Lymphadenopathy:     Cervical: No cervical adenopathy.  Skin:    General: Skin is warm and dry.     Findings: No erythema or rash.  Neurological:     Mental Status: She is alert.     Coordination: Coordination normal.  Psychiatric:        Behavior: Behavior normal.     ED Results / Procedures / Treatments   Labs (all labs ordered are  listed, but only abnormal results are displayed) Labs Reviewed  COMPREHENSIVE METABOLIC PANEL - Abnormal; Notable for the following components:      Result Value   CO2 21 (*)    All other components within normal limits  URINALYSIS, ROUTINE W REFLEX MICROSCOPIC - Abnormal; Notable for the following components:   APPearance HAZY (*)    All other components within normal limits  LIPASE, BLOOD  CBC  HCG, SERUM, QUALITATIVE    EKG None  Radiology No results found.  Procedures Procedures    Medications Ordered in ED Medications - No data to display  ED Course/ Medical Decision Making/ A&P                                 Medical Decision Making Amount and/or Complexity of Data Reviewed Labs: ordered.  Risk Prescription drug management.   Totally normal abdominal exam without any tenderness whatsoever.  Labs: Personally viewed and interpreted labs which show normal lipase, normal metabolic panel, CBC without leukocytosis, she is not pregnant, urinalysis without UTI.  Imaging: Not indicated  ED course: The patient was offered a course of anti-inflammatories as an outpatient.  I suspect this is something musculoskeletal, she has absolutely no tenderness, her exam is unremarkable and reassuring, her labs are totally unremarkable and her vitals are normal.  She is agreeable to the plan.  Stable for discharge        Final Clinical Impression(s) / ED Diagnoses Final diagnoses:  Right lower quadrant abdominal pain    Rx / DC Orders ED Discharge Orders          Ordered    meloxicam (MOBIC) 15 MG tablet  Daily        11/16/23 0914              Eber Hong, MD 11/16/23 [phone]
